# Patient Record
Sex: Female | Born: 2005 | Hispanic: Yes | Marital: Single | Smoking: Never smoker
Health system: Southern US, Community
[De-identification: ages and names within clinical notes are randomized; demographics above are authoritative.]

## PROBLEM LIST (undated history)

## (undated) DIAGNOSIS — J45909 Unspecified asthma, uncomplicated: Secondary | ICD-10-CM

---
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
## 2023-01-11 ENCOUNTER — Other Ambulatory Visit: Payer: Self-pay

## 2023-01-12 MED ORDER — CLINDAMYCIN PHOSPHATE 1 % EX SOLN
Freq: Two times a day (BID) | CUTANEOUS | 0 refills | Status: DC
Start: 2023-01-12 — End: 2024-05-29

## 2023-10-02 ENCOUNTER — Other Ambulatory Visit: Payer: Self-pay | Admitting: Student

## 2023-10-04 MED ORDER — ADAPALENE 0.1 % EX CREA
TOPICAL_CREAM | Freq: Every day | CUTANEOUS | 0 refills | Status: DC
Start: 2023-10-04 — End: 2024-05-29

## 2024-02-01 ENCOUNTER — Ambulatory Visit: Admitting: Pediatrics

## 2024-02-11 ENCOUNTER — Ambulatory Visit: Admitting: Pediatrics

## 2024-02-14 ENCOUNTER — Telehealth: Payer: Self-pay | Admitting: Pediatrics

## 2024-02-14 NOTE — Telephone Encounter (Signed)
 Called main number on file to rs missed 4/4 appt parent answered was advised for pt to call us back

## 2024-05-29 ENCOUNTER — Encounter: Payer: Self-pay | Admitting: Family

## 2024-05-29 ENCOUNTER — Other Ambulatory Visit: Payer: Self-pay | Admitting: Family

## 2024-05-29 ENCOUNTER — Ambulatory Visit (INDEPENDENT_AMBULATORY_CARE_PROVIDER_SITE_OTHER): Admitting: Family

## 2024-05-29 VITALS — BP 104/70 | HR 89 | Ht <= 58 in | Wt 86.0 lb

## 2024-05-29 DIAGNOSIS — L700 Acne vulgaris: Secondary | ICD-10-CM | POA: Diagnosis not present

## 2024-05-29 DIAGNOSIS — Z68.41 Body mass index (BMI) pediatric, 5th percentile to less than 85th percentile for age: Secondary | ICD-10-CM | POA: Diagnosis not present

## 2024-05-29 DIAGNOSIS — Z3202 Encounter for pregnancy test, result negative: Secondary | ICD-10-CM | POA: Diagnosis not present

## 2024-05-29 DIAGNOSIS — Z7187 Encounter for pediatric-to-adult transition counseling: Secondary | ICD-10-CM

## 2024-05-29 DIAGNOSIS — Z0001 Encounter for general adult medical examination with abnormal findings: Secondary | ICD-10-CM | POA: Diagnosis not present

## 2024-05-29 DIAGNOSIS — Z113 Encounter for screening for infections with a predominantly sexual mode of transmission: Secondary | ICD-10-CM

## 2024-05-29 LAB — POCT URINE PREGNANCY: Preg Test, Ur: NEGATIVE

## 2024-05-29 MED ORDER — ADAPALENE 0.1 % EX CREA
TOPICAL_CREAM | Freq: Every day | CUTANEOUS | 0 refills | Status: DC
Start: 2024-05-29 — End: 2024-05-29

## 2024-05-29 MED ORDER — TRETINOIN 0.01 % EX GEL: Freq: Every day | CUTANEOUS | 0 refills | Status: AC

## 2024-05-29 MED ORDER — CLINDAMYCIN PHOS-BENZOYL PEROX 1.2-5 % EX GEL: CUTANEOUS | 0 refills | Status: AC

## 2024-05-29 NOTE — Progress Notes (Unsigned)
 Routine Well-Adolescent Visit   History was provided by the patient.  Jill Perkins is a 18 y.o. female who is here for well child check and acne. PCP Confirmed?  yes  Linard Deland BRAVO, MD  Previsit planning completed:  {YES/NO/NOT APPLICABLE:20182}  Growth Chart Viewed? 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 Studying for PTCB exam Anxiety has been good, no panic attacks.   Final check up- would be open to recommendations on clinics  Interested in tretinoin  gel - has been a couple years  Dental Care: 2 years ago (ask about possible clinics that will accept their insurance)  Menstrual History: LMP 7/16, lasts 5 days, comes regularly  ROS: ***  {Common ambulatory SmartLinks:19316}  No Known Allergies  Past Medical History:  *** Past Medical History:  Diagnosis Date   Asthma     Family History: *** No family history on file.  Social History: Lives with: Mom, step dad, Parental relations: Get along with well Siblings:  3 sisters, 1 brother Friends/Peers: Has good friends but sees them less now that she has graduated. 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  Patient's personal or confidential phone number: *** Tobacco? no Secondhand smoke exposure?no Drugs/EtOH?no Sexually active?no Pregnancy Prevention: Not currently sexually artive, reviewed condoms & plan B Safe at home, in school & in relationships? Yes Guns in the home? no Safe to self? Yes  Physical Exam:  Vitals:   05/29/24 0919  BP: 104/70   Pulse: 89  Weight: 86 lb (39 kg)  Height: 4' 6.5 (1.384 m)   BP 104/70   Pulse 89   Ht 4' 6.5 (1.384 m)   Wt 86 lb (39 kg)   BMI 20.36 kg/m  Body mass index: body mass index is 20.36 kg/m.  Growth %ile SmartLinks can only be used for patients less than 64 years old. Wt Readings from Last 3 Encounters:  05/29/24 86 lb (39 kg)  10/13/22 85 lb (38.6 kg) (<1%, Z= -3.47)*  09/02/22 83 lb 9.6 oz (37.9 kg) (<1%, Z= -3.67)*   * Growth percentiles are based on CDC (Girls, 2-20 Years) data.     Physical Exam  Assessment/Plan: ***  Follow-up:  ***

## 2024-05-29 NOTE — Patient Instructions (Addendum)
  Dental list - Updated 08/03/2023  These dentists accept Medicaid.  The list is a courtesy and for your convenience. Estos dentistas aceptan Medicaid.  La lista es para su guam y es una cortesa.    [facility] [phone] [address] [place] [address] Se habla espaol Ages 67 to 20 years old Accepts ALL Medicaid plans [facility]  [phone] Clancy Hammersmith, DDS (Spanish speaking) [address] [place]  [address] Se habla espaol New patients must be 6 or under. Can remain established until age 81 Parent may go with child if needed Accepts ALL Medicaid plans  Nikki armin Nikki DMD  [phone] [address] [place] [address] Se habla espaol [place] spoken Ages 1 up through adulthood Parent may go with child Accepts ALL Medicaid plans other than family planning Medicaid [facility]  [phone] [address] [place]. [place] [address] Se habla espaol Ages 1-20 Ages 1-3y parents may go back 4+ go back by themselves parents can watch at "[place]" Accepts ALL Medicaid plans  [facility] DDS  [phone]  [address]  [place] [address] [place] spoken New patients must be ages 13 or under. Can remain established until age 55 Approx 3 month wait time  Parent may go with child Accepts ALL Medicaid plans [facility] Dept.     [phone] [address] [place]. [place] [address] Requires certification. Call for information. Requiere certificacin. Llame para informacin. Algunos dias se habla espaol  From birth to 20 years Parent possibly goes with child Accepts ALL Medicaid plans  Abran Kenner DDS  [phone] [address] [place] [address] Se habla espaol  Ages 27 months to 23 years old Parent may go with child Accepts ALL Medicaid plans [facility] DDS     Camellia DOROTHA Cagey DDS  [phone] [address] [place] [address] Se habla espaol- phone interpreters Age 10yo and up through adulthood Approx 3 month wait time Parent may go with child, 15+ go back alone Accepts ALL Medicaid plans  [facility] [phone] Se habla espaol [address] [place], [place] [address]  Ages 9 and under only  Accepts ALL Medicaid plans [facility] [address] [place], [place] [address] Phone: [phone] Fax: [phone] Accepts Medicaid  Elza Hamburger DDS   [phone] [address] [place] [place] [address] Se habla espaol Ages 4 to 35 Parent may NOT go with child Accepts ALL Medicaid plans [facility] [place] Netter [phone] [address] [place], [place] [address]  Se habla espaol Ages 66 and under only Parents may go back with child  Accepts ALL Medicaid plans    Call to set up an appointment! [facility]  [address]  [address] [place] [address] Phone [phone]

## 2024-05-30 ENCOUNTER — Encounter: Payer: Self-pay | Admitting: Family

## 2024-06-02 LAB — URINE CYTOLOGY ANCILLARY ONLY
Chlamydia: NEGATIVE
Comment: NEGATIVE
Comment: NEGATIVE
Comment: NORMAL
Neisseria Gonorrhea: NEGATIVE
Trichomonas: NEGATIVE

## 2024-11-13 ENCOUNTER — Encounter: Payer: Self-pay | Admitting: Pediatrics

## 2024-11-13 VITALS — Temp 102.1°F | Wt 109.6 lb

## 2024-11-13 DIAGNOSIS — R6889 Other general symptoms and signs: Secondary | ICD-10-CM

## 2024-11-13 DIAGNOSIS — R509 Fever, unspecified: Secondary | ICD-10-CM

## 2024-11-13 DIAGNOSIS — J101 Influenza due to other identified influenza virus with other respiratory manifestations: Secondary | ICD-10-CM

## 2024-11-13 LAB — POCT INFLUENZA A/B
Influenza A, POC: NEGATIVE
Influenza B, POC: POSITIVE — AB

## 2024-11-13 NOTE — Progress Notes (Signed)
 " Subjective:    Jill Perkins is a 19 y.o. old female here with her mother and sister(s) for Cough (fever) .    Interpreter present: none needed  PE up to date?:yes Immunizations needed: none  HPI  She has been with fever and cough for three day.  Whole house is sick with similar symptoms.  There is poor appetite.  She is taking motrin and tylenol for fever as well as Dayquil.  She has worse cough at night.    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  Jill Perkins  has a past medical history of Asthma.       Objective:    Temp (!) 102.1 F (38.9 C) (Tympanic)   Wt 109 lb 9.6 oz (49.7 kg)   BMI 25.94 kg/m   Physical Exam Vitals reviewed.  Constitutional:      General: She is not in acute distress.    Appearance: Normal appearance. She is ill-appearing.  HENT:     Head: Normocephalic.     Right Ear: Tympanic membrane normal.     Left Ear: Tympanic membrane normal.     Nose: Congestion and rhinorrhea present.     Mouth/Throat:     Mouth: Mucous membranes are moist.     Pharynx: No oropharyngeal exudate.  Eyes:     Conjunctiva/sclera: Conjunctivae normal.  Cardiovascular:     Rate and Rhythm: Regular rhythm. Tachycardia present.     Pulses: Normal pulses.     Heart sounds: No murmur heard. Pulmonary:     Effort: Pulmonary effort is normal. No respiratory distress.     Breath sounds: Normal breath sounds. No wheezing or rales.     Comments: Coughing throughout exam Musculoskeletal:     Cervical back: Normal range of motion and neck supple.  Lymphadenopathy:      Cervical: No cervical adenopathy.  Neurological:     Mental Status: She is alert.  Psychiatric:        Mood and Affect: Mood normal.        Behavior: Behavior normal.    Results for orders placed or performed in visit on 11/13/24 (from the past 24 hours)  POCT Influenza A/B     Status: Abnormal   Collection Time: 11/13/24  2:35 PM  Result Value Ref Range   Influenza A, POC Negative Negative   Influenza B, POC Positive (A) Negative         Assessment and Plan:     Jill Perkins was seen today for Cough (fever) .   Problem List Items Addressed This Visit   None Visit Diagnoses       Fever, unspecified fever cause    -  Primary   Relevant Orders   POCT Influenza A/B (Completed)     Flu-like symptoms          1. Influenza B - Multiple family members presenting with flu-like symptoms including fever, cough, and sore throat starting within the past 4 days. Rapid flu test is positive.  - Physical examination reveals clear lung sounds without wheezing or crackles - Supportive care with round-the-clock Motrin  and Tylenol for fever and body aches - Maintain hydration, chicken broth recommended - Honey for cough symptoms - Nyquil may help with nighttime cough and sleep - Return to work when [employer] for 24 hours and no vomiting - Return if symptoms persist beyond 7 days or worsen - Return if cough persists beyond 3 weeks    No follow-ups on file.  Deland FORBES Halls, MD        "
